# Patient Record
Sex: Male | Born: 1945 | Race: White | Hispanic: No | Marital: Married
Health system: Southern US, Community
[De-identification: ages and names within clinical notes are randomized; demographics above are authoritative.]

---

## 2006-06-12 ENCOUNTER — Ambulatory Visit: Payer: Self-pay | Admitting: Orthopedic Surgery

## 2006-06-12 ENCOUNTER — Other Ambulatory Visit: Payer: Self-pay

## 2006-06-26 ENCOUNTER — Ambulatory Visit: Payer: Self-pay | Admitting: Orthopedic Surgery

## 2006-07-02 ENCOUNTER — Ambulatory Visit: Payer: Self-pay | Admitting: Orthopedic Surgery

## 2008-10-05 IMAGING — US US EXTREM LOW VENOUS*R*
1 series · 17 of 24 positions shown · non-contrast
Comparison: none

REASON FOR EXAM: right calf swelling   CALL report  [id number]
COMMENTS:

[Series 1: us extrem low venous*right* · 29 acquisitions, 17 frames shown]
[im 1/29]
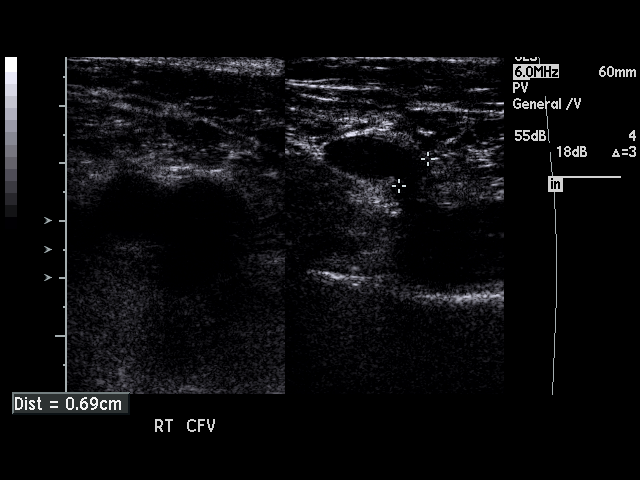
[im 3/29]
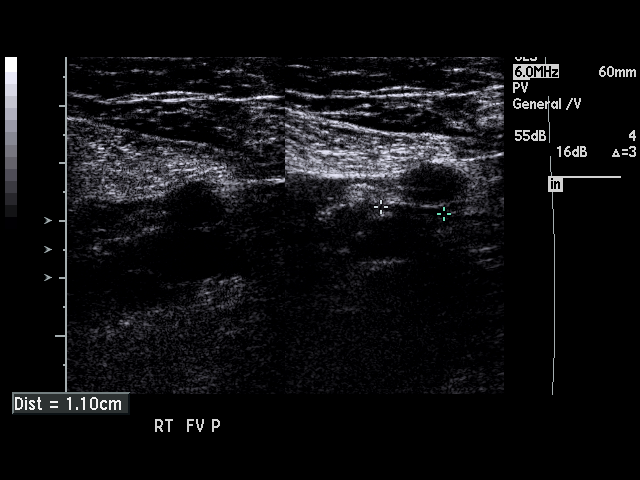
[im 4/29]
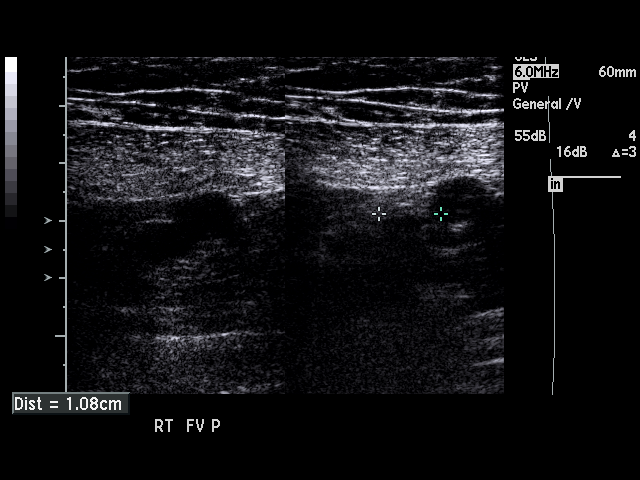
[im 5/29]
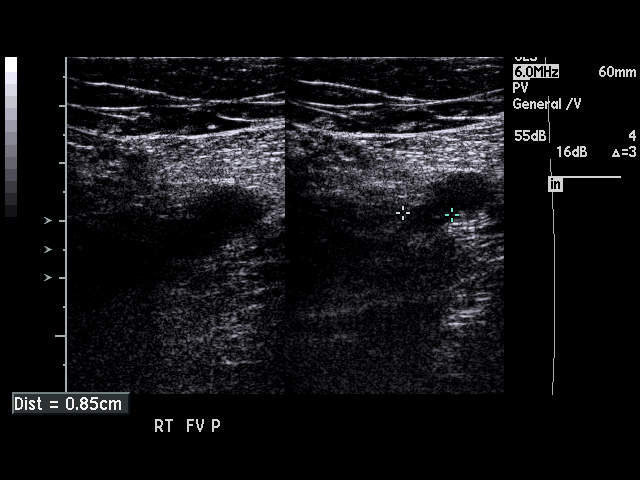
[im 8/29]
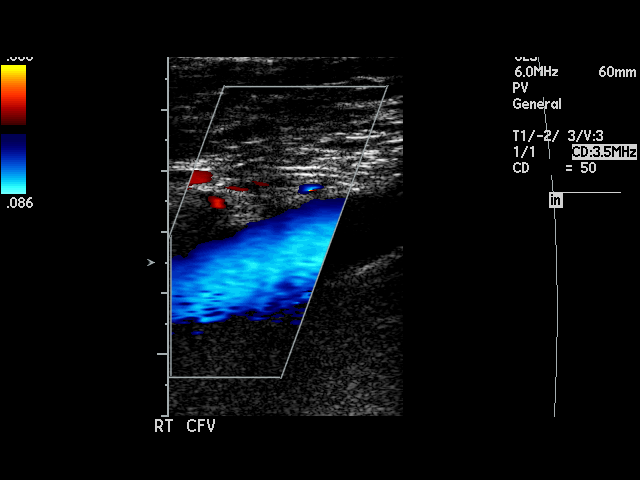
[im 9/29]
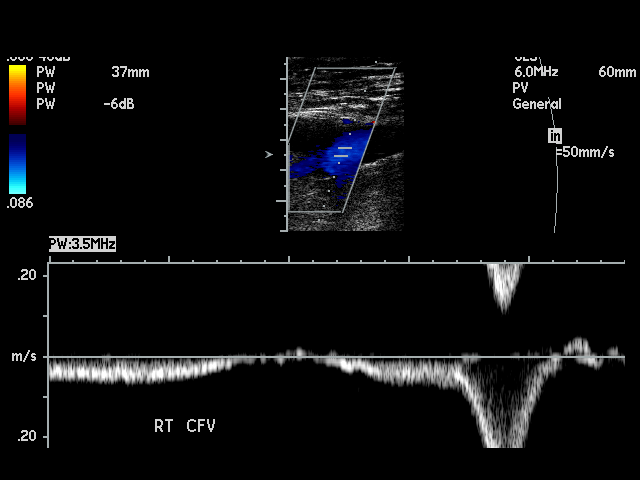
[im 11/29]
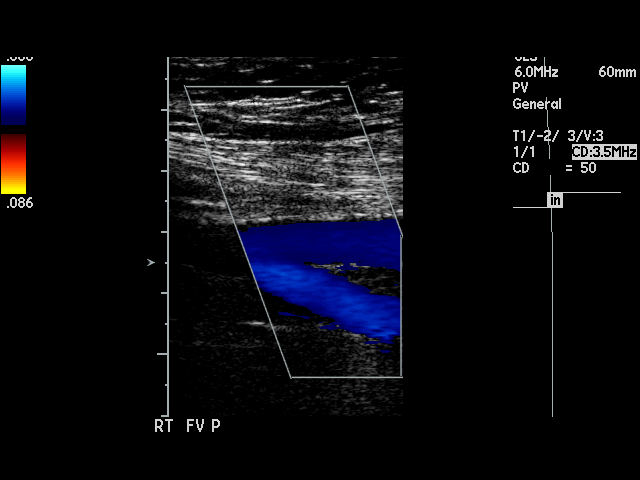
[im 13/29]
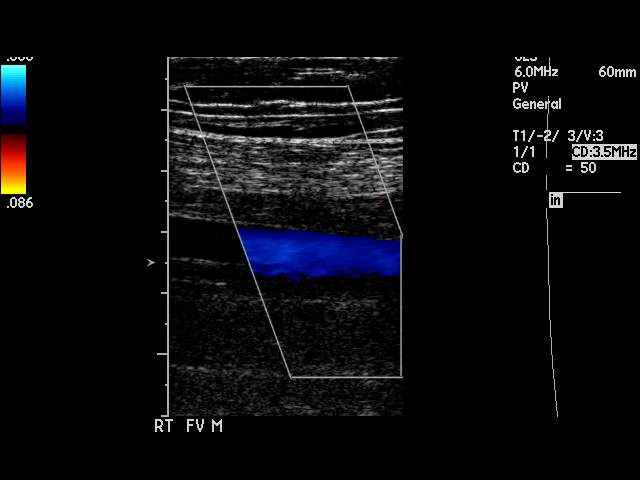
[im 16/29]
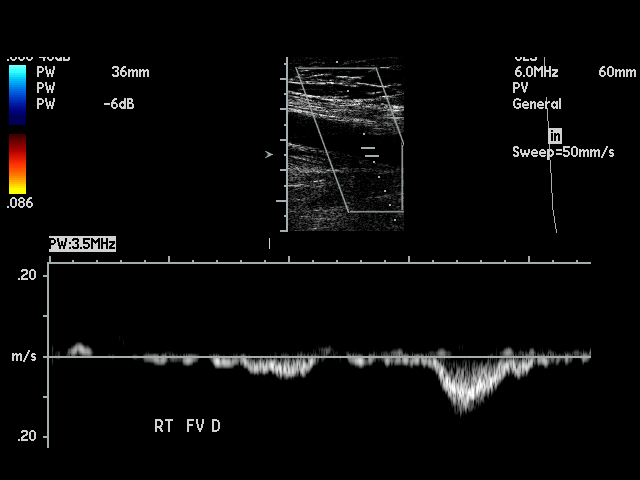
[im 18/29]
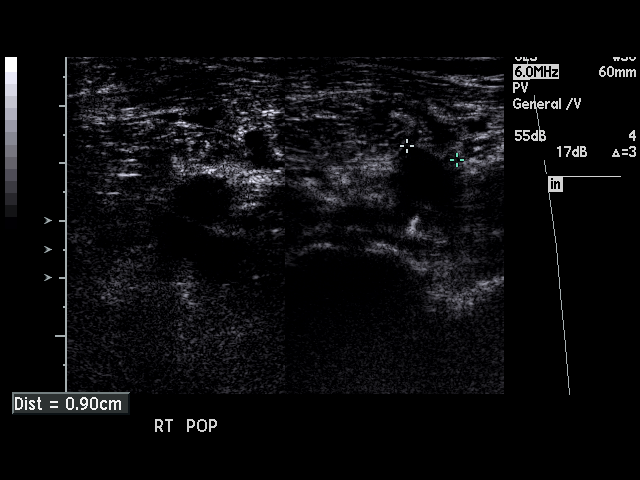
[im 19/29]
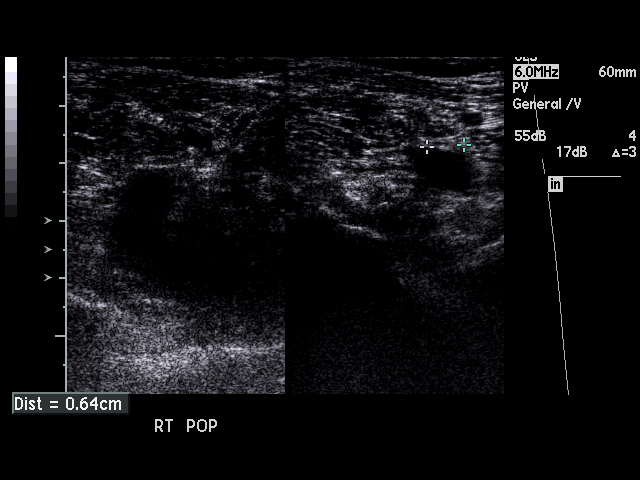
[im 21/29]
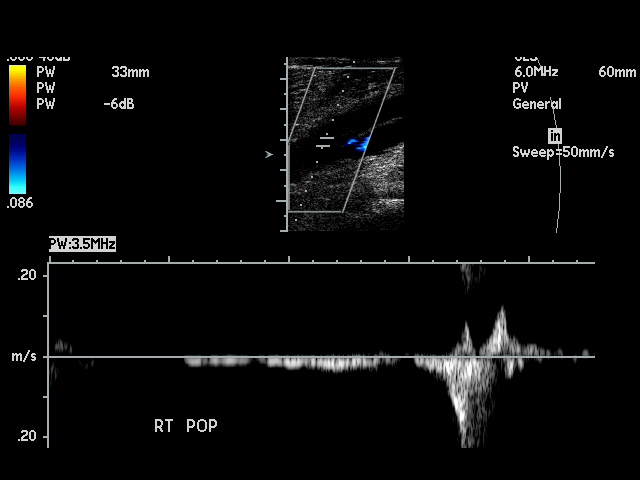
[im 22/29]
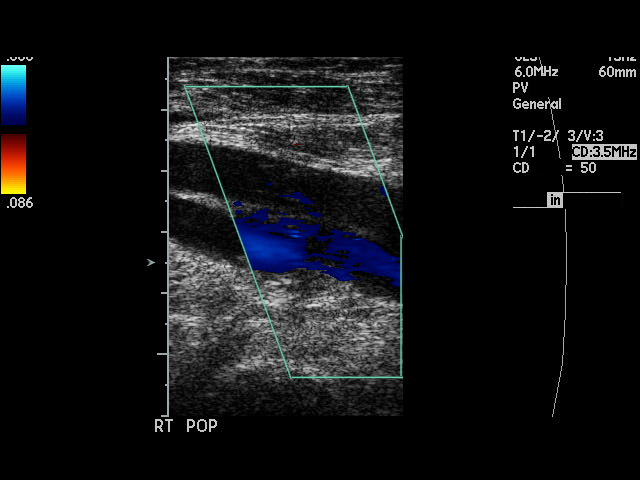
[im 24/29]
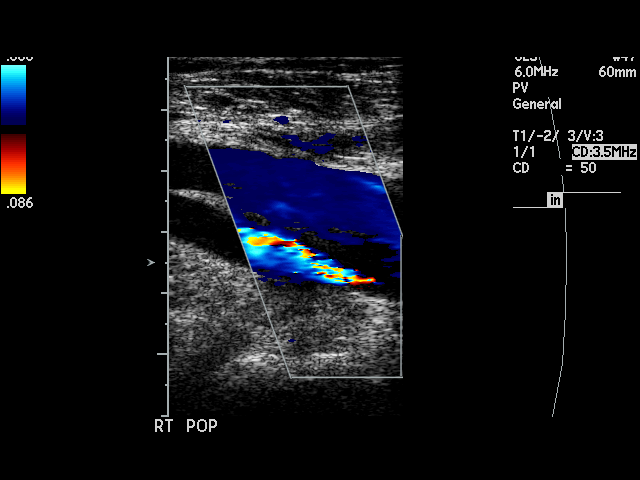
[im 25/29]
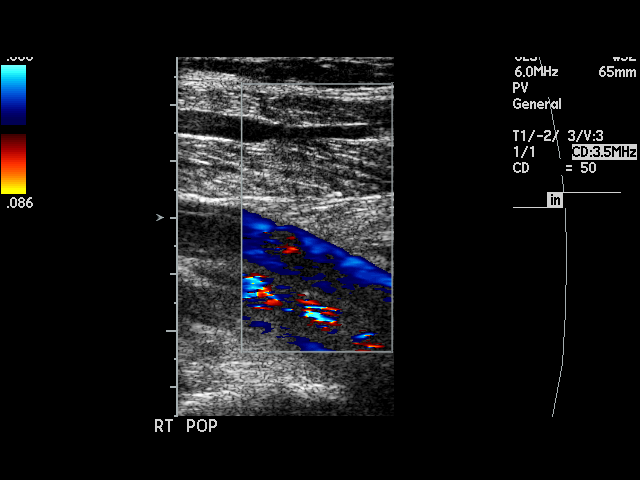
[im 26/29]
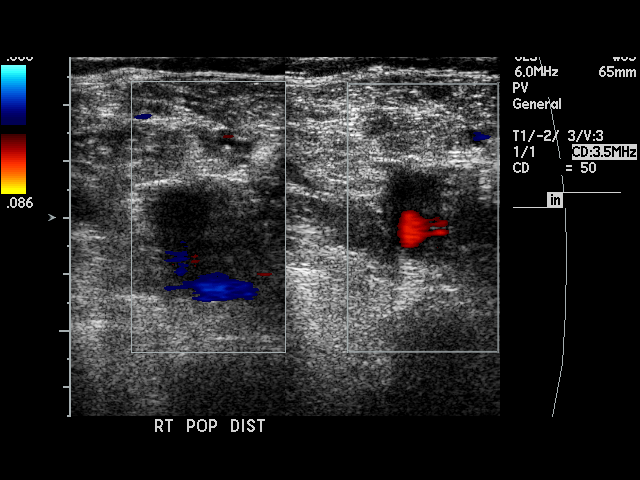
[im 29/29]
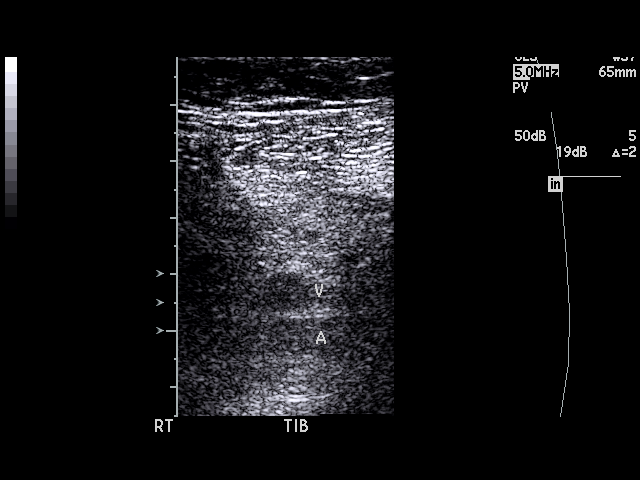

[17 of 24 positions shown; findings below may reference images not displayed]

PROCEDURE:     US  - US DOPPLER LOW EXTR RIGHT  - July 02, 2006  [DATE]

RESULT:     The phasic, augmentation and Valsalva waveforms are normal. The
common femoral and superficial femoral show normal compressibility. The
upper saphenous is patent. In the distal popliteal there is observed loss of
compressibility of the distal popliteal and into the tibial veins. Doppler
examination shows no area of complete occlusion.
IMPRESSION: There is nonocclusive thrombus involving the distal popliteal and extending
to the proximal tibial veins.

## 2019-05-10 ENCOUNTER — Ambulatory Visit: Payer: Self-pay

## 2019-05-21 ENCOUNTER — Ambulatory Visit: Payer: Self-pay

## 2020-01-16 ENCOUNTER — Ambulatory Visit: Payer: Medicare Other | Admitting: Podiatry

## 2020-01-16 ENCOUNTER — Other Ambulatory Visit: Payer: Self-pay

## 2020-01-16 ENCOUNTER — Encounter: Payer: Self-pay | Admitting: Podiatry

## 2020-01-16 ENCOUNTER — Other Ambulatory Visit: Payer: Self-pay | Admitting: Podiatry

## 2020-01-16 ENCOUNTER — Ambulatory Visit (INDEPENDENT_AMBULATORY_CARE_PROVIDER_SITE_OTHER): Payer: Medicare Other

## 2020-01-16 DIAGNOSIS — M79672 Pain in left foot: Secondary | ICD-10-CM

## 2020-01-16 DIAGNOSIS — M722 Plantar fascial fibromatosis: Secondary | ICD-10-CM

## 2020-01-16 DIAGNOSIS — M19071 Primary osteoarthritis, right ankle and foot: Secondary | ICD-10-CM | POA: Diagnosis not present

## 2020-01-16 DIAGNOSIS — M79671 Pain in right foot: Secondary | ICD-10-CM | POA: Diagnosis not present

## 2020-01-16 DIAGNOSIS — Q666 Other congenital valgus deformities of feet: Secondary | ICD-10-CM

## 2020-01-16 DIAGNOSIS — M19072 Primary osteoarthritis, left ankle and foot: Secondary | ICD-10-CM

## 2020-01-16 NOTE — Progress Notes (Signed)
Subjective:  Patient ID: Gordon Brooks, male    DOB: September 24, 1945,  MRN: [id number]  Chief Complaint  Patient presents with  . Foot Pain    Pt stated that he is having a lot of pain in both feet manily in the joint of his big toe     74 y.o. male presents with the above complaint.  Patient presents with complaint of bilateral heel pain that has been going on for quite some time.  He states that he is a big walker enjoys hiking but he started to get a lot of pain in the heel and even more pain in the first metatarsophalangeal joint.  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  Patient states his walking is his main form of exercise.  He denies seeing anyone else prior to seeing me.  He would like to discuss treatment options.  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  His plantar fasciitis pain is very mild in nature however his first metatarsophalangeal joint bilaterally is very painful to touch is 7 out of 10 is dull achy in nature.   Review of Systems: Negative except as noted in the HPI. Denies N/V/F/Ch.  No past medical history on file.  Current Outpatient Medications:  .  lisinopril (ZESTRIL) 40 MG tablet, Take by mouth., Disp: , Rfl:  .  rivaroxaban (XARELTO) 20 MG TABS tablet, Take by mouth., Disp: , Rfl:  .  Cholecalciferol 50 MCG (2000 UT) TABS, Take by mouth., Disp: , Rfl:  .  methylPREDNISolone (MEDROL DOSEPAK) 4 MG TBPK tablet, Take by mouth as directed., Disp: , Rfl:  .  metoprolol succinate (TOPROL-XL) 100 MG 24 hr tablet, Take by mouth., Disp: , Rfl:  .  rosuvastatin (CRESTOR) 20 MG tablet, Take 20 mg by mouth at bedtime., Disp: , Rfl:  .  spironolactone (ALDACTONE) 25 MG tablet, Take 25 mg by mouth daily., Disp: , Rfl:  .  tamsulosin (FLOMAX) 0.4 MG CAPS capsule, Take 0.4 mg by mouth daily., Disp: , Rfl:   Social History   Tobacco Use  Smoking Status Not on file    Not on File Objective:  There  were no vitals filed for this visit. There is no height or weight on file to calculate BMI. Constitutional Well developed. Well nourished.  Vascular Dorsalis pedis pulses palpable bilaterally. Posterior tibial pulses palpable bilaterally. Capillary refill normal to all digits.  No cyanosis or clubbing noted. Pedal hair growth normal.  Neurologic Normal speech. Oriented to person, place, and time. Epicritic sensation to light touch grossly present bilaterally.  Dermatologic Nails well groomed and normal in appearance. No open wounds. No skin lesions.  Orthopedic: Normal joint ROM without pain or crepitus bilaterally. No visible deformities. Tender to palpation at the calcaneal tuber bilaterally. No pain with calcaneal squeeze bilaterally. Ankle ROM diminished range of motion bilaterally. Silfverskiold Test: positive bilaterally.   Radiographs: Taken and reviewed. No acute fractures or dislocations. No evidence of stress fracture.  Plantar heel spur present. Posterior heel spur absent.   Assessment:   1. Foot pain, bilateral   2. Plantar fasciitis of right foot   3. Plantar fasciitis of left foot   4. Arthritis of first metatarsophalangeal (MTP) joint of right foot   5. Arthritis of first metatarsophalangeal (MTP) joint of left foot   6. Pes planovalgus    Plan:  Patient was evaluated and treated and all questions answered.  Plantar  Fasciitis, bilaterally - XR reviewed as above.  - Educated on icing and stretching. Instructions given.  -I will hold off on any injection for now as patient's pain is very mild - DME: Plantar Fascial Brace x2 - Pharmacologic management: None  Bilateral first metatarsophalangeal joint arthritis -I explained to patient the etiology of arthritis and various treatment options were discussed.  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  Patient stated there is pain with it.   He would like to proceed with a steroid injection in both of the joints. -A steroid injection was performed at bilateral first metatarsophalangeal joint using 1% plain Lidocaine and 10 mg of Kenalog. This was well tolerated.  Pes planovalgus -I explained patient the etiology of pes planus deformity and various treatment options were discussed.  Given that patient is having a lot of pain in the first metatarsophalangeal joint as well as the heel I believe will benefit custom-made orthotics to help address the plantar fasciitis as well as Morton's extension of bilateral first metatarsophalangeal joint given the nature of hallux rigidus.  Patient states understanding would like to obtain orthotics -He will be scheduled see rec for custom-made orthotics  No follow-ups on file.

## 2020-02-02 ENCOUNTER — Ambulatory Visit (INDEPENDENT_AMBULATORY_CARE_PROVIDER_SITE_OTHER): Payer: Medicare Other | Admitting: Orthotics

## 2020-02-02 ENCOUNTER — Other Ambulatory Visit: Payer: Self-pay

## 2020-02-02 DIAGNOSIS — M79671 Pain in right foot: Secondary | ICD-10-CM

## 2020-02-02 DIAGNOSIS — M79672 Pain in left foot: Secondary | ICD-10-CM | POA: Diagnosis not present

## 2020-02-02 DIAGNOSIS — M722 Plantar fascial fibromatosis: Secondary | ICD-10-CM

## 2020-02-02 NOTE — Progress Notes (Signed)
Cast today for f/o to address chronic foot pain after hiking 3 or more miles; need deep heel cup, logn support, and forefoot cushioning.

## 2020-02-18 ENCOUNTER — Other Ambulatory Visit: Payer: Self-pay | Admitting: Podiatry

## 2020-02-18 ENCOUNTER — Encounter: Payer: Self-pay | Admitting: Podiatry

## 2020-02-18 ENCOUNTER — Other Ambulatory Visit: Payer: Self-pay

## 2020-02-18 ENCOUNTER — Ambulatory Visit: Payer: Medicare Other | Admitting: Podiatry

## 2020-02-18 ENCOUNTER — Ambulatory Visit (INDEPENDENT_AMBULATORY_CARE_PROVIDER_SITE_OTHER): Payer: Medicare Other

## 2020-02-18 DIAGNOSIS — M722 Plantar fascial fibromatosis: Secondary | ICD-10-CM | POA: Diagnosis not present

## 2020-02-18 DIAGNOSIS — M19071 Primary osteoarthritis, right ankle and foot: Secondary | ICD-10-CM | POA: Diagnosis not present

## 2020-02-18 DIAGNOSIS — M19072 Primary osteoarthritis, left ankle and foot: Secondary | ICD-10-CM | POA: Diagnosis not present

## 2020-02-18 NOTE — Progress Notes (Signed)
Subjective:  Patient ID: Gordon Brooks, male    DOB: Jan 26, 1946,  MRN: [id number]  Chief Complaint  Patient presents with  . Plantar Fasciitis    bilateral plantar fasciitis follow up     74 y.o. male presents with the above complaint.  Patient presents with follow-up of right heel pain that seems to have gotten worse.  Patient states he goes back and forth between left side right side now the right side is hurting a lot.  Patient states that he did not get injection the plantar fashion as it was mild at that time.  Now it seems to be hurting a lot.  Patient also had a secondary follow-up for bilateral first metatarsophalangeal joint arthritis that is severe in nature.  Patient states the injection did not last long.  At this time we did briefly discuss surgical options.  She would like to think about and get back to me in 6 weeks.   Review of Systems: Negative except as noted in the HPI. Denies N/V/F/Ch.  No past medical history on file.  Current Outpatient Medications:  .  Cholecalciferol 50 MCG (2000 UT) TABS, Take by mouth., Disp: , Rfl:  .  lisinopril (ZESTRIL) 40 MG tablet, Take by mouth., Disp: , Rfl:  .  methylPREDNISolone (MEDROL DOSEPAK) 4 MG TBPK tablet, Take by mouth as directed., Disp: , Rfl:  .  metoprolol succinate (TOPROL-XL) 100 MG 24 hr tablet, Take by mouth., Disp: , Rfl:  .  rivaroxaban (XARELTO) 20 MG TABS tablet, Take by mouth., Disp: , Rfl:  .  rosuvastatin (CRESTOR) 20 MG tablet, Take 20 mg by mouth at bedtime., Disp: , Rfl:  .  spironolactone (ALDACTONE) 25 MG tablet, Take 25 mg by mouth daily., Disp: , Rfl:  .  tamsulosin (FLOMAX) 0.4 MG CAPS capsule, Take 0.4 mg by mouth daily., Disp: , Rfl:   Social History   Tobacco Use  Smoking Status Not on file    Not on File Objective:  There were no vitals filed for this visit. There is no height or weight on file to calculate BMI. Constitutional Well developed. Well nourished.  Vascular Dorsalis pedis  pulses palpable bilaterally. Posterior tibial pulses palpable bilaterally. Capillary refill normal to all digits.  No cyanosis or clubbing noted. Pedal hair growth normal.  Neurologic Normal speech. Oriented to person, place, and time. Epicritic sensation to light touch grossly present bilaterally.  Dermatologic Nails well groomed and normal in appearance. No open wounds. No skin lesions.  Orthopedic: Normal joint ROM without pain or crepitus bilaterally. No visible deformities. Tender to palpation at the calcaneal tuber right. No pain with calcaneal squeeze right Ankle ROM diminished range of motion bilaterally Silfverskiold Test: positive bilaterally.   Radiographs: Taken and reviewed. No acute fractures or dislocations. No evidence of stress fracture.  Plantar heel spur present. Posterior heel spur absent.   Assessment:   1. Plantar fasciitis of right foot   2. Plantar fasciitis of left foot    Plan:  Patient was evaluated and treated and all questions answered.  Plantar Fasciitis, right - XR reviewed as above.  - Educated on icing and stretching. Instructions given.  -Injection was delivered to the calcaneal tuber medial side of the right - DME: Continue wearing plantar fascial braces - Pharmacologic management: None  Bilateral first metatarsophalangeal joint arthritis -I explained to patient the etiology of arthritis and various treatment options were discussed.  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   -Clinically patient's pain did not improve from steroid injection and therefore I will hold off on any further injection.  Patient does have severe arthrosis I discussed with him briefly the surgical treatment plans and available to him.  For now we will just work on getting getting orthotics with Morton's extension therefore take the stress off of the joint.  Patient agrees with the plan  Pes  planovalgus -I explained patient the etiology of pes planus deformity and various treatment options were discussed.  Given that patient is having a lot of pain in the first metatarsophalangeal joint as well as the heel I believe will benefit custom-made orthotics to help address the plantar fasciitis as well as Morton's extension of bilateral first metatarsophalangeal joint given the nature of hallux rigidus.  Patient states understanding would like to obtain orthotics -He has been casted for orthotics is awaiting getting orthotics made.  No follow-ups on file.

## 2020-03-02 ENCOUNTER — Ambulatory Visit: Payer: Medicare Other | Admitting: Orthotics

## 2020-03-02 ENCOUNTER — Other Ambulatory Visit: Payer: Self-pay

## 2020-03-02 DIAGNOSIS — M722 Plantar fascial fibromatosis: Secondary | ICD-10-CM

## 2020-03-02 NOTE — Progress Notes (Signed)
Patient picked up f/o and was pleased with fit, comfort, and function.  Worked well with footwear.  Told of rbeak in period and how to report any issues.  

## 2020-04-21 ENCOUNTER — Encounter: Payer: Self-pay | Admitting: Podiatry

## 2020-04-21 ENCOUNTER — Ambulatory Visit: Payer: Medicare Other | Admitting: Podiatry

## 2020-04-21 ENCOUNTER — Other Ambulatory Visit: Payer: Self-pay

## 2020-04-21 DIAGNOSIS — M722 Plantar fascial fibromatosis: Secondary | ICD-10-CM | POA: Diagnosis not present

## 2020-04-21 NOTE — Progress Notes (Signed)
Subjective:  Patient ID: Gordon Brooks, male    DOB: [birth year],  MRN: [id number]  Chief Complaint  Patient presents with  . Plantar Fasciitis    PT stated that he is doing okay he still has some pain.    75 y.o. male presents with the above complaint.  Patient presents with a follow-up of right plantar fasciitis.  Patient states that he is doing a lot better.  He still has occasional on and off pain but today he does not have any pain.  He has been wearing his brace as well as orthotics which has been helping.  I encouraged him to utilize orthotics more and can transition out of the brace.  He states understanding.  He denies any other acute complaints.  Overall he is about 90 to 95% improved   Review of Systems: Negative except as noted in the HPI. Denies N/V/F/Ch.  No past medical history on file.  Current Outpatient Medications:  .  Cholecalciferol 50 MCG (2000 UT) TABS, Take by mouth., Disp: , Rfl:  .  lisinopril (ZESTRIL) 40 MG tablet, Take by mouth., Disp: , Rfl:  .  methylPREDNISolone (MEDROL DOSEPAK) 4 MG TBPK tablet, Take by mouth as directed., Disp: , Rfl:  .  metoprolol succinate (TOPROL-XL) 100 MG 24 hr tablet, Take by mouth., Disp: , Rfl:  .  rivaroxaban (XARELTO) 20 MG TABS tablet, Take by mouth., Disp: , Rfl:  .  rosuvastatin (CRESTOR) 20 MG tablet, Take 20 mg by mouth at bedtime., Disp: , Rfl:  .  spironolactone (ALDACTONE) 25 MG tablet, Take 25 mg by mouth daily., Disp: , Rfl:  .  tamsulosin (FLOMAX) 0.4 MG CAPS capsule, Take 0.4 mg by mouth daily., Disp: , Rfl:   Social History   Tobacco Use  Smoking Status Not on file  Smokeless Tobacco Not on file    Not on File Objective:  There were no vitals filed for this visit. There is no height or weight on file to calculate BMI. Constitutional Well developed. Well nourished.  Vascular Dorsalis pedis pulses palpable bilaterally. Posterior tibial pulses palpable bilaterally. Capillary refill normal to all  digits.  No cyanosis or clubbing noted. Pedal hair growth normal.  Neurologic Normal speech. Oriented to person, place, and time. Epicritic sensation to light touch grossly present bilaterally.  Dermatologic Nails well groomed and normal in appearance. No open wounds. No skin lesions.  Orthopedic: Normal joint ROM without pain or crepitus bilaterally. No visible deformities. Now tender to palpation at the calcaneal tuber right. No pain with calcaneal squeeze right Ankle ROM diminished range of motion bilaterally Silfverskiold Test: positive bilaterally.   Radiographs: Taken and reviewed. No acute fractures or dislocations. No evidence of stress fracture.  Plantar heel spur present. Posterior heel spur absent.   Assessment:   1. Plantar fasciitis of right foot    Plan:  Patient was evaluated and treated and all questions answered.  Plantar Fasciitis, right -Clinically healed.  We will continue to monitor with orthotics.  I encouraged utilizing orthotics with good shoes which she states he is already doing so.  If any foot and ankle issues arise in the future come back and see me.  Patient states understanding  Bilateral first metatarsophalangeal joint arthritis -I explained to patient the etiology of arthritis and various treatment options were discussed.  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   -Clinically patient's pain did not improve from steroid injection  and therefore I will hold off on any further injection.  Patient does have severe arthrosis I discussed with him briefly the surgical treatment plans and available to him.  For now we will just work on getting getting orthotics with Morton's extension therefore take the stress off of the joint.  Patient agrees with the plan  Pes planovalgus -I explained patient the etiology of pes planus deformity and various treatment options were discussed.  Given  that patient is having a lot of pain in the first metatarsophalangeal joint as well as the heel I believe will benefit custom-made orthotics to help address the plantar fasciitis as well as Morton's extension of bilateral first metatarsophalangeal joint given the nature of hallux rigidus.  Patient states understanding would like to obtain orthotics -Patient has obtained orthotics and functioning well in them.  No follow-ups on file.

## 2020-07-14 ENCOUNTER — Ambulatory Visit (INDEPENDENT_AMBULATORY_CARE_PROVIDER_SITE_OTHER): Payer: Medicare Other | Admitting: Podiatry

## 2020-07-14 ENCOUNTER — Other Ambulatory Visit: Payer: Self-pay

## 2020-07-14 DIAGNOSIS — Z01818 Encounter for other preprocedural examination: Secondary | ICD-10-CM | POA: Diagnosis not present

## 2020-07-14 DIAGNOSIS — M2021 Hallux rigidus, right foot: Secondary | ICD-10-CM

## 2020-07-14 DIAGNOSIS — M19071 Primary osteoarthritis, right ankle and foot: Secondary | ICD-10-CM

## 2020-07-15 ENCOUNTER — Encounter: Payer: Self-pay | Admitting: Podiatry

## 2020-07-15 NOTE — Progress Notes (Signed)
Subjective:  Patient ID: Gordon Brooks, male    DOB: Apr 02, 1946,  MRN: [id number]  Chief Complaint  Patient presents with  . Plantar Fasciitis    Surgery consult     75 y.o. male presents with the above complaint.  Patient presents with a follow-up of right plantar fasciitis.  Patient states that he is doing a lot better.  He still has occasional on and off pain but today he does not have any pain.  He has been wearing his brace as well as orthotics which has been helping.  I encouraged him to utilize orthotics more and can transition out of the brace.  He states understanding.  He denies any other acute complaints.  Overall he is about 90 to 95% improved   Review of Systems: Negative except as noted in the HPI. Denies N/V/F/Ch.  No past medical history on file.  Current Outpatient Medications:  .  Cholecalciferol 50 MCG (2000 UT) TABS, Take by mouth., Disp: , Rfl:  .  lisinopril (ZESTRIL) 40 MG tablet, Take by mouth., Disp: , Rfl:  .  methylPREDNISolone (MEDROL DOSEPAK) 4 MG TBPK tablet, Take by mouth as directed., Disp: , Rfl:  .  metoprolol succinate (TOPROL-XL) 100 MG 24 hr tablet, Take by mouth., Disp: , Rfl:  .  rivaroxaban (XARELTO) 20 MG TABS tablet, Take by mouth., Disp: , Rfl:  .  rosuvastatin (CRESTOR) 20 MG tablet, Take 20 mg by mouth at bedtime., Disp: , Rfl:  .  spironolactone (ALDACTONE) 25 MG tablet, Take 25 mg by mouth daily., Disp: , Rfl:  .  tamsulosin (FLOMAX) 0.4 MG CAPS capsule, Take 0.4 mg by mouth daily., Disp: , Rfl:   Social History   Tobacco Use  Smoking Status Not on file  Smokeless Tobacco Not on file    Not on File Objective:  There were no vitals filed for this visit. There is no height or weight on file to calculate BMI. Constitutional Well developed. Well nourished.  Vascular Dorsalis pedis pulses palpable bilaterally. Posterior tibial pulses palpable bilaterally. Capillary refill normal to all digits.  No cyanosis or clubbing  noted. Pedal hair growth normal.  Neurologic Normal speech. Oriented to person, place, and time. Epicritic sensation to light touch grossly present bilaterally.  Dermatologic Nails well groomed and normal in appearance. No open wounds. No skin lesions.  Orthopedic: Normal joint ROM without pain or crepitus bilaterally. No visible deformities. Now tender to palpation at the calcaneal tuber right. No pain with calcaneal squeeze right Ankle ROM diminished range of motion bilaterally Silfverskiold Test: positive bilaterally.   Radiographs: Taken and reviewed. No acute fractures or dislocations. No evidence of stress fracture.  Plantar heel spur present. Posterior heel spur absent.   Assessment:   1. Arthritis of first metatarsophalangeal (MTP) joint of right foot   2. Preoperative examination   3. Hallux rigidus of right foot    Plan:  Patient was evaluated and treated and all questions answered.  Plantar Fasciitis, right -Clinically healed.  We will continue to monitor with orthotics.  I encouraged utilizing orthotics with good shoes which she states he is already doing so.  If any foot and ankle issues arise in the future come back and see me.  Patient states understanding  Bilateral first metatarsophalangeal joint arthritis joint right greater than left -I discussed with the patient that given that patient is having continuous pain to the right first metatarsophalangeal that he would benefit from surgical fusion of the joint.  Patient agrees  with the plan would like to proceed with the fusion of the first metatarsophalangeal joint with fixation.  I discussed my radiographical findings with the patient in extensive detail given that there is severe arthrosis with hallux rigidus present I believe he will benefit from surgical fusion.  He has failed multiple conservative treatment options including orthotics as well as multiple injections.  He now wishes to undergo surgical fusion. -I  discussed my postop protocol as well as my skin incision placement.  He will be nonweightbearing for 3 to 4 weeks followed by weightbearing as tolerated.  Patient states understanding would like to proceed with the surgery. -Informed surgical risk consent was reviewed and read aloud to the patient.  I reviewed the films.  I have discussed my findings with the patient in great detail.  I have discussed all risks including but not limited to infection, stiffness, scarring, limp, disability, deformity, damage to blood vessels and nerves, numbness, poor healing, need for braces, arthritis, chronic pain, amputation, death.  All benefits and realistic expectations discussed in great detail.  I have made no promises as to the outcome.  I have provided realistic expectations.  I have offered the patient a 2nd opinion, which they have declined and assured me they preferred to proceed despite the risks -A total of 33 minutes was spent in direct patient care as well as pre and post patient encounter activities.  This includes documentation as well as reviewing patient chart for labs, imaging, past medical, surgical, social, and family history as documented in the EMR.  I have reviewed medication allergies as documented in EMR.  I discussed the etiology of condition and treatment options from conservative to surgical care.  All risks and benefit of the treatment course was discussed in detail.  All questions were answered and return appointment was discussed.  Since the visit completed in an ambulatory/outpatient setting, the patient and/or parent/guardian has been advised to contact the providers office for worsening condition and seek medical treatment and/or call 911 if the patient deems either is necessary.   Pes planovalgus -I explained patient the etiology of pes planus deformity and various treatment options were discussed.  Given that patient is having a lot of pain in the first metatarsophalangeal joint as well  as the heel I believe will benefit custom-made orthotics to help address the plantar fasciitis as well as Morton's extension of bilateral first metatarsophalangeal joint given the nature of hallux rigidus.  Patient states understanding would like to obtain orthotics -Patient has obtained orthotics and functioning well in them.  No follow-ups on file.

## 2020-08-20 ENCOUNTER — Telehealth: Payer: Self-pay | Admitting: Urology

## 2020-08-20 NOTE — Telephone Encounter (Signed)
DOS- 09/13/20  HALLUX MPJ FUSION RIGHT --- [id number]   [employer] EFFECTIVE DATE - 05/11/20   PLAN DEDUCTIBLE - $100.00 W/ $0.00 REMAINING OUT OF POCKET - $1,000.00 W/ $716.30 REAMINING COINSURANCE - 0% COPAY - $0.00   PER [employer] WEB SITE FOR CPT CODE 55732 Notification or Prior Authorization is not required for the requested services   Decision ID #:[id number]

## 2020-09-13 ENCOUNTER — Other Ambulatory Visit: Payer: Self-pay | Admitting: Podiatry

## 2020-09-13 ENCOUNTER — Encounter: Payer: Self-pay | Admitting: Podiatry

## 2020-09-13 DIAGNOSIS — M13871 Other specified arthritis, right ankle and foot: Secondary | ICD-10-CM

## 2020-09-13 MED ORDER — OXYCODONE-ACETAMINOPHEN 5-325 MG PO TABS: 1.0000 | ORAL_TABLET | ORAL | 0 refills | Status: DC | PRN

## 2020-09-13 MED ORDER — IBUPROFEN 800 MG PO TABS: 800.0000 mg | ORAL_TABLET | Freq: Four times a day (QID) | ORAL | 1 refills | Status: AC | PRN

## 2020-09-22 ENCOUNTER — Ambulatory Visit (INDEPENDENT_AMBULATORY_CARE_PROVIDER_SITE_OTHER): Payer: Medicare Other | Admitting: Podiatry

## 2020-09-22 ENCOUNTER — Ambulatory Visit (INDEPENDENT_AMBULATORY_CARE_PROVIDER_SITE_OTHER): Payer: Medicare Other

## 2020-09-22 ENCOUNTER — Other Ambulatory Visit: Payer: Self-pay

## 2020-09-22 DIAGNOSIS — M19071 Primary osteoarthritis, right ankle and foot: Secondary | ICD-10-CM | POA: Diagnosis not present

## 2020-09-22 DIAGNOSIS — Z9889 Other specified postprocedural states: Secondary | ICD-10-CM

## 2020-09-24 ENCOUNTER — Encounter: Payer: Self-pay | Admitting: Podiatry

## 2020-09-24 NOTE — Progress Notes (Signed)
  Subjective:  Patient ID: Gordon Brooks, male    DOB: November 08, 1945,  MRN: [id number]  Chief Complaint  Patient presents with   Routine Post Op    POST OP DOS 6.6.22    DOS: 09/13/2020 Procedure: Right first MPJ fusion  75 y.o. male returns for post-op check.  Patient states that he is doing well.  Mild pain.  He is mostly taking ibuprofen if needed.  He has not been nonweightbearing to the right lower extremity denies any other acute complaints.  Review of Systems: Negative except as noted in the HPI. Denies N/V/F/Ch.  No past medical history on file.  Current Outpatient Medications:    Cholecalciferol 50 MCG (2000 UT) TABS, Take by mouth., Disp: , Rfl:    ibuprofen (ADVIL) 800 MG tablet, Take 1 tablet (800 mg total) by mouth every 6 (six) hours as needed., Disp: 60 tablet, Rfl: 1   lisinopril (ZESTRIL) 40 MG tablet, Take by mouth., Disp: , Rfl:    methylPREDNISolone (MEDROL DOSEPAK) 4 MG TBPK tablet, Take by mouth as directed., Disp: , Rfl:    metoprolol succinate (TOPROL-XL) 100 MG 24 hr tablet, Take by mouth., Disp: , Rfl:    oxyCODONE-acetaminophen (PERCOCET) 5-325 MG tablet, Take 1-2 tablets by mouth every 4 (four) hours as needed for severe pain., Disp: 30 tablet, Rfl: 0   rivaroxaban (XARELTO) 20 MG TABS tablet, Take by mouth., Disp: , Rfl:    rosuvastatin (CRESTOR) 20 MG tablet, Take 20 mg by mouth at bedtime., Disp: , Rfl:    spironolactone (ALDACTONE) 25 MG tablet, Take 25 mg by mouth daily., Disp: , Rfl:    tamsulosin (FLOMAX) 0.4 MG CAPS capsule, Take 0.4 mg by mouth daily., Disp: , Rfl:   Social History   Tobacco Use  Smoking Status Not on file  Smokeless Tobacco Not on file    Not on File Objective:  There were no vitals filed for this visit. There is no height or weight on file to calculate BMI. Constitutional Well developed. Well nourished.  Vascular Foot warm and well perfused. Capillary refill normal to all digits.   Neurologic Normal speech. Oriented to  person, place, and time. Epicritic sensation to light touch grossly present bilaterally.  Dermatologic Skin healing well without signs of infection. Skin edges well coapted without signs of infection.  Orthopedic: Tenderness to palpation noted about the surgical site.   Radiographs: 3 views of skeletally mature the right foot: Hardware is intact.  No loosening or backing out noted.  Consolidation is starting to be seen.  Good alignment of correction noted Assessment:   1. Arthritis of first metatarsophalangeal (MTP) joint of right foot   2. Status post foot surgery    Plan:  Patient was evaluated and treated and all questions answered.  S/p foot surgery right -Progressing as expected post-operatively. -XR: See above -WB Status: Nonweightbearing to the right lower extremity with a knee scooter/crutches -Sutures: Intact.  No clinical signs of dehiscence noted.  No complication noted. -Medications:  -Foot redressed.  No follow-ups on file.

## 2020-10-06 ENCOUNTER — Ambulatory Visit (INDEPENDENT_AMBULATORY_CARE_PROVIDER_SITE_OTHER): Payer: Medicare Other | Admitting: Podiatry

## 2020-10-06 ENCOUNTER — Other Ambulatory Visit: Payer: Self-pay

## 2020-10-06 ENCOUNTER — Encounter: Payer: Self-pay | Admitting: Podiatry

## 2020-10-06 DIAGNOSIS — M19071 Primary osteoarthritis, right ankle and foot: Secondary | ICD-10-CM

## 2020-10-06 DIAGNOSIS — Z9889 Other specified postprocedural states: Secondary | ICD-10-CM

## 2020-10-06 NOTE — Progress Notes (Signed)
  Subjective:  Patient ID: Gordon Brooks, male    DOB: 05-23-1945,  MRN: [id number]  Chief Complaint  Patient presents with   Routine Post Op    POST OP DOS 6.6.22    DOS: 09/13/2020 Procedure: Right first MPJ fusion  75 y.o. male returns for post-op check.  Patient states that he is doing well.  Mild pain.  He is mostly taking ibuprofen if needed.  He has not been nonweightbearing to the right lower extremity denies any other acute complaints.  Review of Systems: Negative except as noted in the HPI. Denies N/V/F/Ch.  No past medical history on file.  Current Outpatient Medications:    Cholecalciferol 50 MCG (2000 UT) TABS, Take by mouth., Disp: , Rfl:    ibuprofen (ADVIL) 800 MG tablet, Take 1 tablet (800 mg total) by mouth every 6 (six) hours as needed., Disp: 60 tablet, Rfl: 1   lisinopril (ZESTRIL) 40 MG tablet, Take by mouth., Disp: , Rfl:    methylPREDNISolone (MEDROL DOSEPAK) 4 MG TBPK tablet, Take by mouth as directed., Disp: , Rfl:    metoprolol succinate (TOPROL-XL) 100 MG 24 hr tablet, Take by mouth., Disp: , Rfl:    oxyCODONE-acetaminophen (PERCOCET) 5-325 MG tablet, Take 1-2 tablets by mouth every 4 (four) hours as needed for severe pain., Disp: 30 tablet, Rfl: 0   rivaroxaban (XARELTO) 20 MG TABS tablet, Take by mouth., Disp: , Rfl:    rosuvastatin (CRESTOR) 20 MG tablet, Take 20 mg by mouth at bedtime., Disp: , Rfl:    spironolactone (ALDACTONE) 25 MG tablet, Take 25 mg by mouth daily., Disp: , Rfl:    tamsulosin (FLOMAX) 0.4 MG CAPS capsule, Take 0.4 mg by mouth daily., Disp: , Rfl:   Social History   Tobacco Use  Smoking Status Not on file  Smokeless Tobacco Not on file    Not on File Objective:  There were no vitals filed for this visit. There is no height or weight on file to calculate BMI. Constitutional Well developed. Well nourished.  Vascular Foot warm and well perfused. Capillary refill normal to all digits.   Neurologic Normal speech. Oriented to  person, place, and time. Epicritic sensation to light touch grossly present bilaterally.  Dermatologic Skin completely epithelialized.  No clinical signs of infection noted.  No dehiscence noted.  Stiff first MPJ joint noted.  Good range of motion noted at the IPJ joint  Orthopedic: No tenderness to palpation noted about the surgical site.   Radiographs: 3 views of skeletally mature the right foot: Hardware is intact.  No loosening or backing out noted.  Consolidation is starting to be seen.  Good alignment of correction noted Assessment:   1. Arthritis of first metatarsophalangeal (MTP) joint of right foot   2. Status post foot surgery     Plan:  Patient was evaluated and treated and all questions answered.  S/p foot surgery right -Progressing as expected post-operatively. -XR: See above -WB Status: Weightbearing as tolerated with a cam boot -Sutures: Removed no clinical signs of dehiscence noted.  No complication noted. -Medications:  -Foot redressed.  No follow-ups on file.

## 2020-11-03 ENCOUNTER — Encounter: Payer: Self-pay | Admitting: Podiatry

## 2020-11-03 ENCOUNTER — Other Ambulatory Visit: Payer: Self-pay

## 2020-11-03 ENCOUNTER — Ambulatory Visit (INDEPENDENT_AMBULATORY_CARE_PROVIDER_SITE_OTHER): Payer: Medicare Other | Admitting: Podiatry

## 2020-11-03 DIAGNOSIS — Z9889 Other specified postprocedural states: Secondary | ICD-10-CM

## 2020-11-03 DIAGNOSIS — M19071 Primary osteoarthritis, right ankle and foot: Secondary | ICD-10-CM

## 2020-11-03 NOTE — Progress Notes (Signed)
  Subjective:  Patient ID: Gordon Brooks, male    DOB: 31-Oct-1945,  MRN: [id number]  Chief Complaint  Patient presents with   Routine Post Op    POST OP DOS 6.6.22    DOS: 09/13/2020 Procedure: Right first MPJ fusion  75 y.o. male returns for post-op check.  Patient states that he is doing well.  Mild pain.  He is mostly taking ibuprofen if needed.  He has been weightbearing as tolerated with a cam boot..  Review of Systems: Negative except as noted in the HPI. Denies N/V/F/Ch.  No past medical history on file.  Current Outpatient Medications:    Cholecalciferol 50 MCG (2000 UT) TABS, Take by mouth., Disp: , Rfl:    ibuprofen (ADVIL) 800 MG tablet, Take 1 tablet (800 mg total) by mouth every 6 (six) hours as needed., Disp: 60 tablet, Rfl: 1   lisinopril (ZESTRIL) 40 MG tablet, Take by mouth., Disp: , Rfl:    methylPREDNISolone (MEDROL DOSEPAK) 4 MG TBPK tablet, Take by mouth as directed., Disp: , Rfl:    metoprolol succinate (TOPROL-XL) 100 MG 24 hr tablet, Take by mouth., Disp: , Rfl:    oxyCODONE-acetaminophen (PERCOCET) 5-325 MG tablet, Take 1-2 tablets by mouth every 4 (four) hours as needed for severe pain., Disp: 30 tablet, Rfl: 0   rivaroxaban (XARELTO) 20 MG TABS tablet, Take by mouth., Disp: , Rfl:    rosuvastatin (CRESTOR) 20 MG tablet, Take 20 mg by mouth at bedtime., Disp: , Rfl:    spironolactone (ALDACTONE) 25 MG tablet, Take 25 mg by mouth daily., Disp: , Rfl:    tamsulosin (FLOMAX) 0.4 MG CAPS capsule, Take 0.4 mg by mouth daily., Disp: , Rfl:   Social History   Tobacco Use  Smoking Status Not on file  Smokeless Tobacco Not on file    Not on File Objective:  There were no vitals filed for this visit. There is no height or weight on file to calculate BMI. Constitutional Well developed. Well nourished.  Vascular Foot warm and well perfused. Capillary refill normal to all digits.   Neurologic Normal speech. Oriented to person, place, and time. Epicritic  sensation to light touch grossly present bilaterally.  Dermatologic Skin completely epithelialized.  No clinical signs of infection noted.  No dehiscence noted.  Stiff first MPJ joint noted.  Good range of motion noted at the IPJ joint  Orthopedic: No tenderness to palpation noted about the surgical site.   Radiographs: 3 views of skeletally mature the right foot: Hardware is intact.  No loosening or backing out noted.  Consolidation is starting to be seen.  Good alignment of correction noted Assessment:   1. Arthritis of first metatarsophalangeal (MTP) joint of right foot   2. Status post foot surgery      Plan:  Patient was evaluated and treated and all questions answered.  S/p foot surgery right -Progressing as expected post-operatively. -XR: See above -WB Status: Weightbearing as tolerated with a cam boot -Sutures: Removed no clinical signs of dehiscence noted.  No complication noted. -Medications:  -Clinically #patient no longer has any pain.  He has been ambulating in boot without any restriction.  At this time I asked him to transition to regular shoes and if any foot and ankle issues arise in future I will asked him to come see me.  Patient states understanding.  No follow-ups on file.

## 2021-05-20 ENCOUNTER — Other Ambulatory Visit: Payer: Self-pay

## 2021-05-20 ENCOUNTER — Other Ambulatory Visit: Payer: Self-pay | Admitting: Family Medicine

## 2021-05-20 ENCOUNTER — Ambulatory Visit
Admission: RE | Admit: 2021-05-20 | Discharge: 2021-05-20 | Disposition: A | Discharge status: Home / Self Care | Payer: Self-pay | Source: Ambulatory Visit | Attending: Family Medicine | Admitting: Family Medicine

## 2021-05-20 DIAGNOSIS — R058 Other specified cough: Secondary | ICD-10-CM

## 2021-09-22 ENCOUNTER — Ambulatory Visit: Payer: Medicare Other | Admitting: Podiatry

## 2021-09-22 ENCOUNTER — Ambulatory Visit (INDEPENDENT_AMBULATORY_CARE_PROVIDER_SITE_OTHER): Payer: Medicare Other

## 2021-09-22 DIAGNOSIS — M19071 Primary osteoarthritis, right ankle and foot: Secondary | ICD-10-CM

## 2021-09-22 DIAGNOSIS — L819 Disorder of pigmentation, unspecified: Secondary | ICD-10-CM

## 2021-09-22 DIAGNOSIS — Z9889 Other specified postprocedural states: Secondary | ICD-10-CM

## 2021-09-23 NOTE — Progress Notes (Signed)
Subjective:  Patient ID: Gordon Brooks, male    DOB: 12-28-1945,  MRN: [id number]  Chief Complaint  Patient presents with   Toe Pain    Right hallux toe discoloration     76 y.o. male presents with the above complaint.  Patient presents with concern of discoloration/darkening of the skin around the first MPJ site.  He wanted to make sure things healing well.  He had a surgery done for MPJ fusion on 09/13/2020.  He states been a year is doing well some parts of.  He wanted evaluated make sure that there is nothing going on.  It does not hurt him too much she is able to ambulate without any acute issues.   Review of Systems: Negative except as noted in the HPI. Denies N/V/F/Ch.  No past medical history on file.  Current Outpatient Medications:    Cholecalciferol 50 MCG (2000 UT) TABS, Take by mouth., Disp: , Rfl:    ibuprofen (ADVIL) 800 MG tablet, Take 1 tablet (800 mg total) by mouth every 6 (six) hours as needed., Disp: 60 tablet, Rfl: 1   lisinopril (ZESTRIL) 40 MG tablet, Take by mouth., Disp: , Rfl:    methylPREDNISolone (MEDROL DOSEPAK) 4 MG TBPK tablet, Take by mouth as directed., Disp: , Rfl:    metoprolol succinate (TOPROL-XL) 100 MG 24 hr tablet, Take by mouth., Disp: , Rfl:    oxyCODONE-acetaminophen (PERCOCET) 5-325 MG tablet, Take 1-2 tablets by mouth every 4 (four) hours as needed for severe pain., Disp: 30 tablet, Rfl: 0   rivaroxaban (XARELTO) 20 MG TABS tablet, Take by mouth., Disp: , Rfl:    rosuvastatin (CRESTOR) 20 MG tablet, Take 20 mg by mouth at bedtime., Disp: , Rfl:    spironolactone (ALDACTONE) 25 MG tablet, Take 25 mg by mouth daily., Disp: , Rfl:    tamsulosin (FLOMAX) 0.4 MG CAPS capsule, Take 0.4 mg by mouth daily., Disp: , Rfl:   Social History   Tobacco Use  Smoking Status Not on file  Smokeless Tobacco Not on file    Not on File Objective:  There were no vitals filed for this visit. There is no height or weight on file to calculate  BMI. Constitutional Well developed. Well nourished.  Vascular Dorsalis pedis pulses palpable bilaterally. Posterior tibial pulses palpable bilaterally. Capillary refill normal to all digits.  No cyanosis or clubbing noted. Pedal hair growth normal.  Neurologic Normal speech. Oriented to person, place, and time. Epicritic sensation to light touch grossly present bilaterally.  Dermatologic Nails well groomed and normal in appearance. No open wounds. No skin lesions.  Orthopedic: Pain on palpation right first metatarsophalangeal joint.  No range of motion noted to the MPJ joint.  Stiff rigid MPJ joint noted.  No pain with range of motion of the interphalangeal joint.  No sesamoid-itis pain noted.  Very mild pain on palpation to the hardware.   Radiographs: 3 views of skeletally mature the right foot: Hardware is intact no signs of loosening or backing out noted.  Per x-rays solid fusion noted across the first MPJ site.  There may be small area of pseudoarthrosis present. Assessment:   1. Discolored skin   2. Arthritis of first metatarsophalangeal (MTP) joint of right foot    Plan:  Patient was evaluated and treated and all questions answered.  Right first metatarsophalangeal joint discoloration with underlying history of fusion/arthrodesis -All questions and concerns were discussed with the patient in extensive detail -I discussed with him that sometimes longstanding edema  around the joint can lead to discoloration of the toe.  There is no concern for gangrene or infection.  Patient states understanding -At this time clinically does not bother him too much.  If his pain continues to get worse or bothers him a lot we will plan on getting a CT scan to assess the fusion.  Prior x-ray it looks like there is a lot of consolidation across the first MPJ site. -I discussed shoe gear modification as well  No follow-ups on file.

## 2022-07-03 ENCOUNTER — Other Ambulatory Visit: Payer: Self-pay | Admitting: Physician Assistant

## 2022-07-03 ENCOUNTER — Ambulatory Visit
Admission: RE | Admit: 2022-07-03 | Discharge: 2022-07-03 | Disposition: A | Discharge status: Home / Self Care | Payer: Medicare Other | Source: Ambulatory Visit | Attending: Physician Assistant | Admitting: Physician Assistant

## 2022-07-03 DIAGNOSIS — R079 Chest pain, unspecified: Secondary | ICD-10-CM

## 2022-07-19 ENCOUNTER — Ambulatory Visit
Admission: RE | Admit: 2022-07-19 | Discharge: 2022-07-19 | Disposition: A | Discharge status: Home / Self Care | Payer: Medicare Other | Source: Ambulatory Visit | Attending: Physician Assistant | Admitting: Physician Assistant

## 2022-07-19 ENCOUNTER — Other Ambulatory Visit: Payer: Self-pay | Admitting: Physician Assistant

## 2022-07-19 DIAGNOSIS — M542 Cervicalgia: Secondary | ICD-10-CM

## 2022-07-19 DIAGNOSIS — R0781 Pleurodynia: Secondary | ICD-10-CM

## 2023-01-27 IMAGING — CR DG CHEST 2V
2 series · 2 of 2 positions shown · non-contrast
Comparison: 05/20/2021.

CLINICAL DATA: Cough since having FTVUO-LG 4 weeks ago. Short of
breath. Chest tightness.

EXAM:
CHEST - 2 VIEW

[w chest pa]
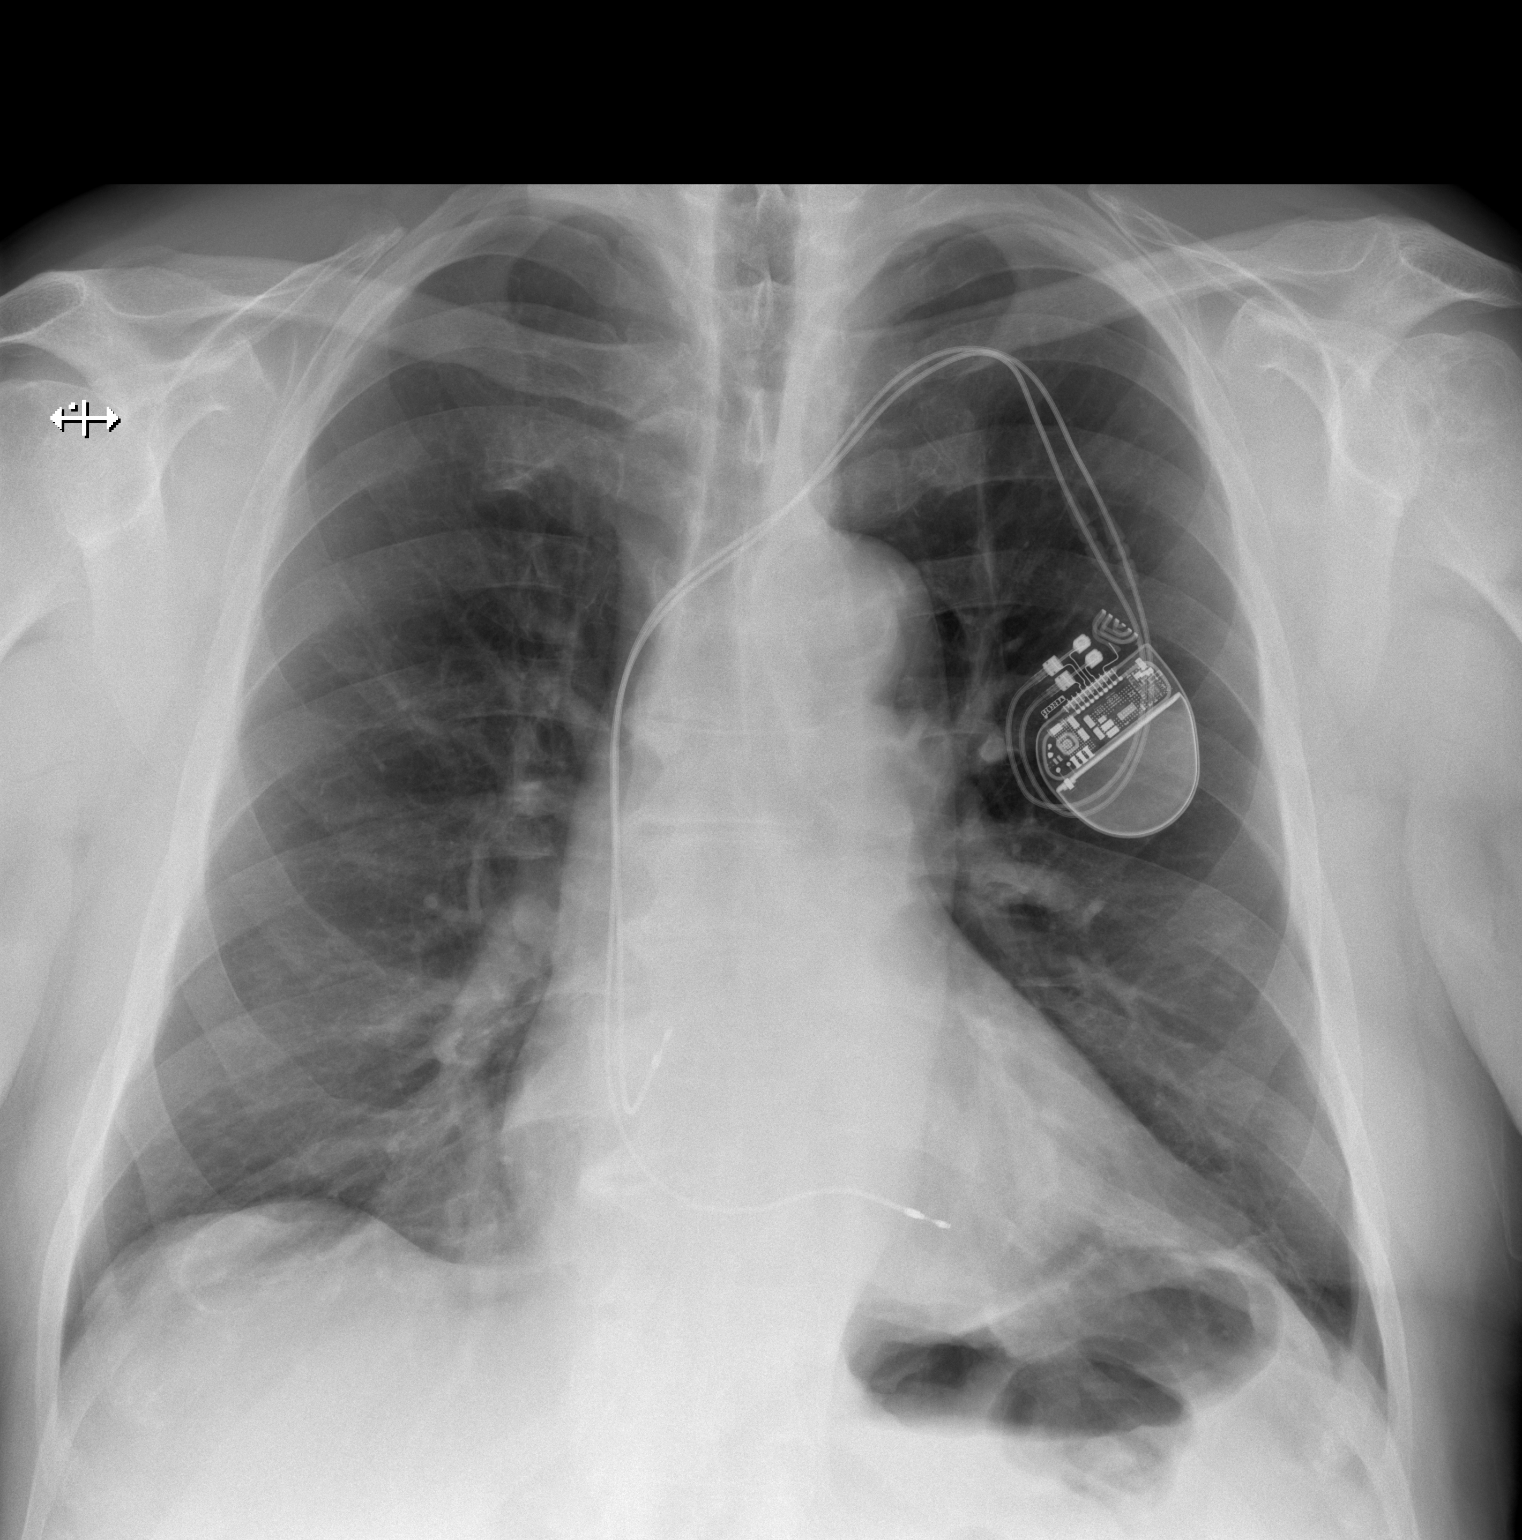

[w chest lat]
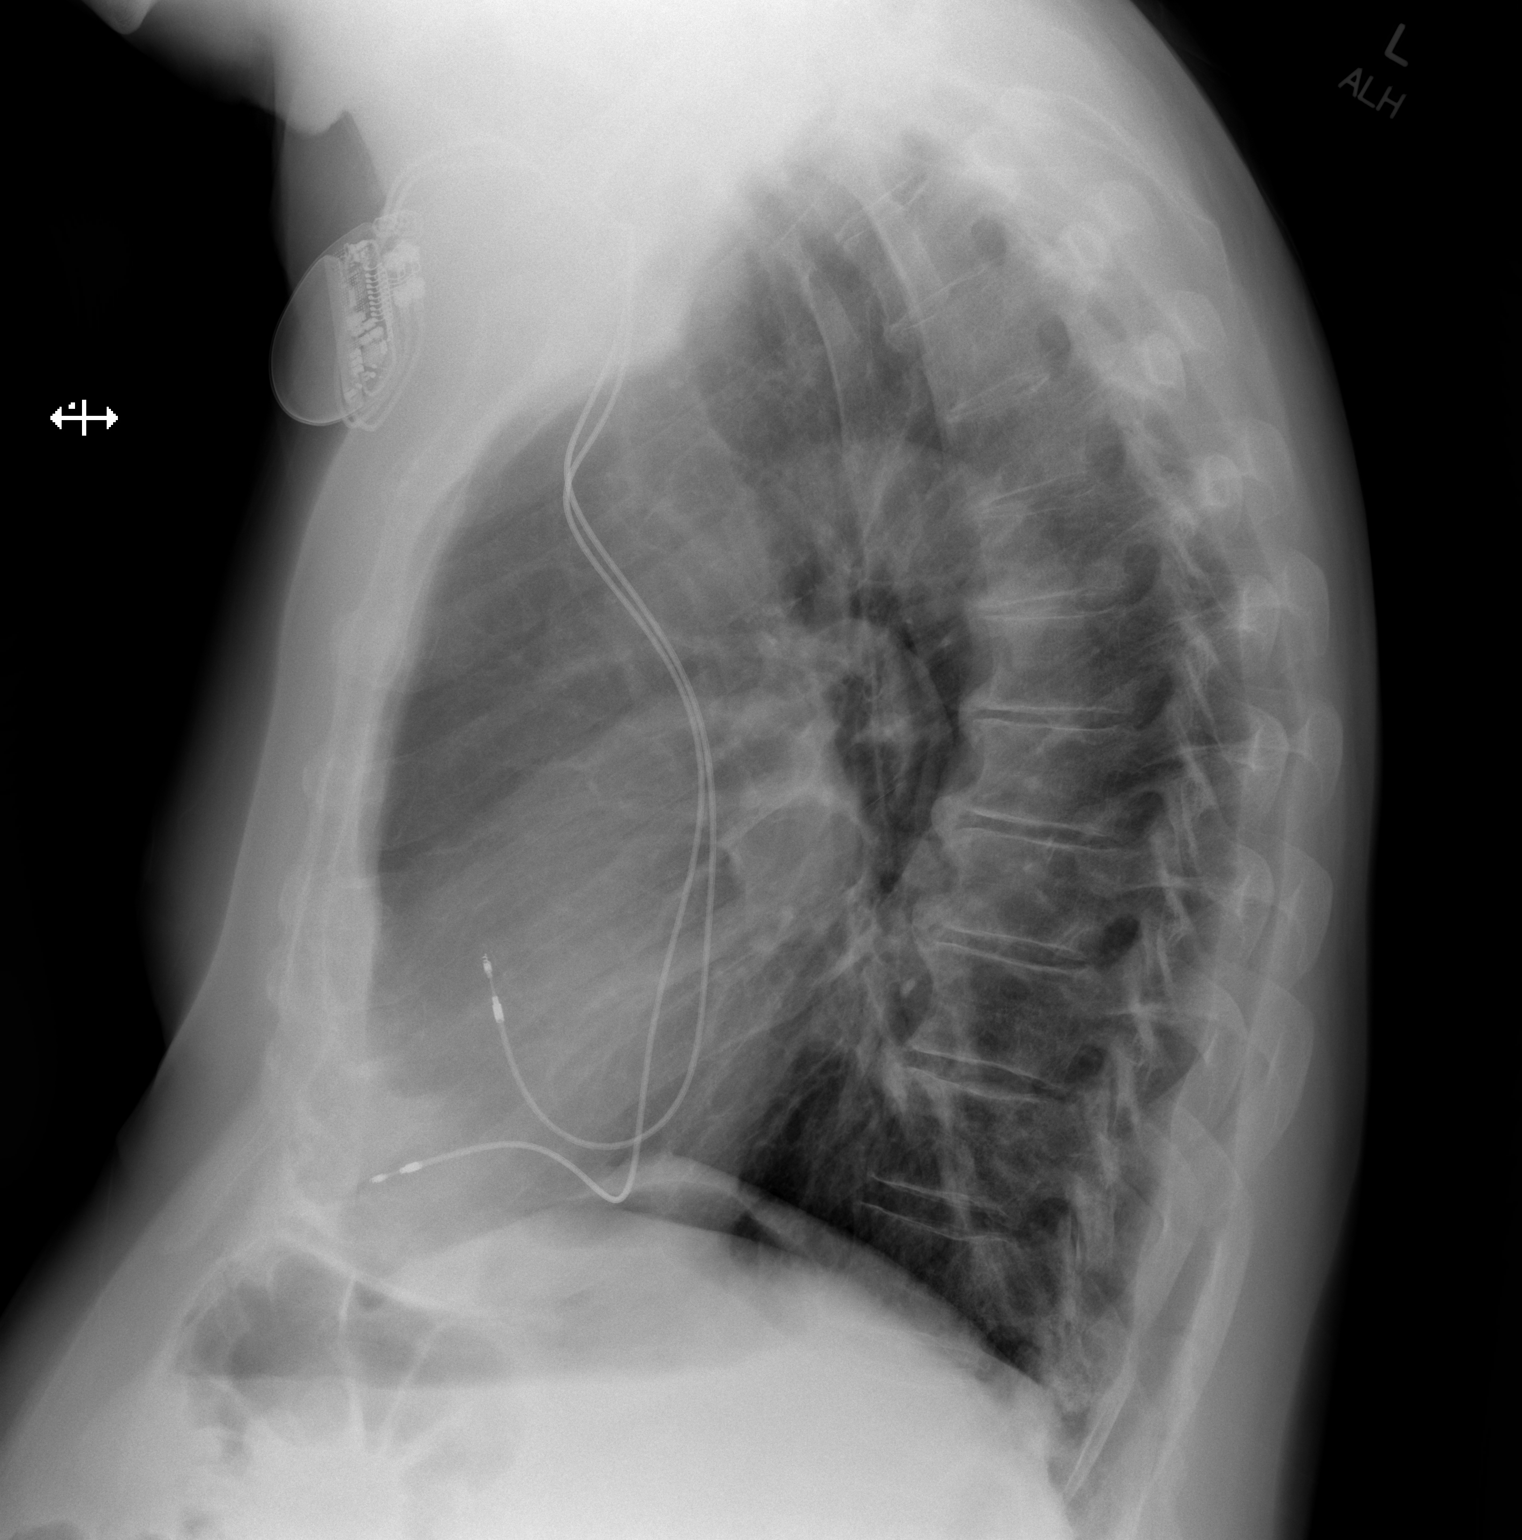

[2 of 2 positions shown; findings below may reference images not displayed]

FINDINGS: Cardiac silhouette is normal in size. Left anterior chest wall
sequential pacemaker is well positioned. No mediastinal or hilar
masses or evidence of adenopathy.

Clear lungs.  No pleural effusion or pneumothorax.

Skeletal structures are intact.
IMPRESSION: No active cardiopulmonary disease.

## 2023-07-30 ENCOUNTER — Other Ambulatory Visit (HOSPITAL_BASED_OUTPATIENT_CLINIC_OR_DEPARTMENT_OTHER): Payer: Self-pay

## 2023-11-16 ENCOUNTER — Other Ambulatory Visit (HOSPITAL_COMMUNITY): Payer: Self-pay | Admitting: Orthopedic Surgery

## 2023-11-16 DIAGNOSIS — M25562 Pain in left knee: Secondary | ICD-10-CM

## 2023-11-27 NOTE — Progress Notes (Signed)
 Date of Service: 11/23/23  Patient: Gordon Brooks MRN: [id number]  DEVICE CHECK: [employer] PPM Device diagnostics obtained during office interrogation. Reference scanned documents for detailed information and physician interpretation.   GINGER GALEAZZI, MA.

## 2023-11-30 NOTE — CV Procedure (Signed)
  Device system confirmed to be MRI conditional, with implant date > 6 weeks ago, and no evidence of abandoned or epicardial leads in review of most recent CXR  Device last cleared by EP Provider: Prentice Passey 11/29/23  Clearance is good through for 1 year as long as parameters remain stable at time of check. If pt undergoes a cardiac device procedure during that time, they should be re-cleared.   Tachy-therapies to be programmed off if applicable with device back to pre-MRI settings after completion of exam.  [employer] was available remotely to assist in programming recommendations.   Rocky Catalan, RT  11/30/2023 8:01 AM

## 2023-12-03 ENCOUNTER — Ambulatory Visit (HOSPITAL_COMMUNITY)
Admission: RE | Admit: 2023-12-03 | Discharge: 2023-12-03 | Disposition: A | Discharge status: Home / Self Care | Source: Ambulatory Visit | Attending: Orthopedic Surgery | Admitting: Orthopedic Surgery

## 2023-12-03 DIAGNOSIS — M25562 Pain in left knee: Secondary | ICD-10-CM | POA: Insufficient documentation

## 2023-12-03 NOTE — Progress Notes (Signed)
 Patient was monitored by this RN during MRI scan due to presence of a pacemaker. Cardiac rhythm was continuously monitored throughout the procedure. Prior to the start of the scan, the pacemaker was placed in MRI-safe mode by the MRI technician and/or pacemaker representative. Following the completion of the scan, the device was returned to its pre-MRI settings. Neurological status and orientation post-procedure were unchanged from baseline.   Pre-procedure Heart Rate (Prior to being placed in MRI safe mode): Post-procedure Heart Rate (Once pacemaker is returned to baseline mode):   60 : Pre 61 : Post

## 2024-02-26 ENCOUNTER — Ambulatory Visit: Admitting: Internal Medicine

## 2024-03-03 ENCOUNTER — Encounter (HOSPITAL_BASED_OUTPATIENT_CLINIC_OR_DEPARTMENT_OTHER): Payer: Self-pay

## 2024-03-04 ENCOUNTER — Ambulatory Visit: Attending: Internal Medicine | Admitting: Internal Medicine

## 2024-03-04 VITALS — BP 163/108 | HR 61 | Ht 76.0 in | Wt 264.0 lb

## 2024-03-04 DIAGNOSIS — I7121 Aneurysm of the ascending aorta, without rupture: Secondary | ICD-10-CM | POA: Diagnosis not present

## 2024-03-04 DIAGNOSIS — Z8241 Family history of sudden cardiac death: Secondary | ICD-10-CM | POA: Diagnosis not present

## 2024-03-04 DIAGNOSIS — I480 Paroxysmal atrial fibrillation: Secondary | ICD-10-CM | POA: Insufficient documentation

## 2024-03-04 NOTE — Progress Notes (Signed)
 Cardiology Office Note:  .    Date:  03/04/2024  ID:  Gordon Brooks, DOB 1945-08-02, MRN [id number] PCP: [place] Pac, MD  [facility] Providers Cardiologist:  None     CC: Aorta care Consulted for the evaluation of possible transition of care at the behest of Dr. [place]   History of Present Illness: Gordon Brooks    Gordon Brooks is a 78 y.o. male with a thoracic aortic aneurysm who presents for cardiovascular evaluation and management.  He has a thoracic aortic aneurysm, with the most recent measurement being 49 millimeters, which has increased from a stable 47 millimeters.  He has a history of paroxysmal atrial fibrillation and atrial flutter, for which he underwent atrial flutter ablation in 2010. He is predominantly AV paced and has a dual chamber [employer] MRI compatible pacemaker. He is monitored annually by [employer].  He experiences labile blood pressures and has been using a blood pressure monitor for the past two and a half months. He experiences episodes of dizziness and shortness of breath, which he attributes to low blood pressure due to inadequate fluid intake. His blood pressure has been more stable recently, with a reading of 125/78 mmHg yesterday.  He has a history of obstructive sleep apnea and chronic venous stasis disease, for which he has undergone venous insufficiency interventions in the past.  His family history is significant for heart issues among male relatives, including his father who had an aortic aneurysm repair and died suddenly, possibly from an aortic dissection.  Discussed the use of AI scribe software for clinical note transcription with the patient, who gave verbal consent to proceed.   Relevant histories: .  Social  - Last seen in [place]; has father hx of SCD, no biologic children ROS: As per HPI.   Physical Exam:    VS:  BP (!) 163/108   Pulse 61   Ht 6' 4 (1.93 m)   Wt 264 lb (119.7 kg)   SpO2 94%   BMI 32.14  kg/m    Wt Readings from Last 3 Encounters:  03/04/24 264 lb (119.7 kg)    Gen: no distress Cardiac: No Rubs or Gallops, soft diastolic Murmur, RRR +2 radial pulses Respiratory: Clear to auscultation bilaterally, normal effort, normal  respiratory rate GI: Soft, nontender, non-distended  MS: No  edema;  moves all extremities Integument: Skin feels warm Neuro:  At time of evaluation, alert and oriented to person/place/time/situation  Psych: Normal affect, patient feels ok    ASSESSMENT AND PLAN: .    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 No significant aortic regurgitation. Family history of sudden cardiac death. Previous evaluation by a heart surgeon indicated no immediate concern. Current management focuses on blood pressure control. Lisinopril may have a protective effect in aortopathies. CT scan is the gold standard for aortic dimension assessment. Echocardiogram will assess for aortic regurgitation and provide additional data. - Ordered echocardiogram to assess aortic root dilation and check for aortic regurgitation. - If echocardiogram shows significant changes, will order CT scan to confirm findings. - Continue annual CT scan monitoring if no significant changes. - Discussed genetic testing due to family history of sudden cardiac death (deferred) - Documented aortic aneurysm in allergy list to avoid Levaquin use.  Paroxysmal atrial fibrillation and atrial flutter, status post ablation, with dual chamber pacemaker Status post atrial flutter ablation in 2010.  Currently predominantly AV paced with a dual chamber pacemaker. No recent rhythm issues reported. Blood work shows normal counts despite anticoagulation therapy. No bleeding complications reported. Device monitoring is essential to ensure proper function and detect silent arrhythmias. - Referred to  electrophysiologist for device monitoring and follow-up. - Continue anticoagulation therapy unless blood counts become abnormal.  Labial blood pressure (essential hypertension) Blood pressure has been variable with recent readings of 125/78 mmHg. Previous episodes of hypotension associated with dizziness and shortness of breath, likely due to inadequate hydration. Current management includes lisinopril. Blood pressure control is crucial for managing thoracic aortic aneurysm. Lisinopril may be increased if blood pressure remains elevated. - Continue home blood pressure monitoring. - Increase lisinopril dosage if blood pressure exceeds 130/90 mmHg. - Ensure adequate hydration to prevent hypotension.  He will finish seeing Dr. Meade and let us  know how much, if any, of his care he would like to transition over  Gordon Leavens, MD FASE [facility] Cardiologist [facility]  [address] [place], [address] [place], [place] [address] [phone]  1:33 PM

## 2024-03-04 NOTE — Patient Instructions (Signed)
 Medication Instructions:   NO CHANGES today  *If you need a refill on your cardiac medications before your next appointment, please call your pharmacy*  Your next appointment:    Please contact our office about changing cardiology care / echocardiogram testing.   We recommend signing up for the patient portal called MyChart.  Sign up information is provided on this After Visit Summary.  MyChart is used to connect with patients for Virtual Visits (Telemedicine).  Patients are able to view lab/test results, encounter notes, upcoming appointments, etc.  Non-urgent messages can be sent to your provider as well.   To learn more about what you can do with MyChart, go to [email or web address].

## 2024-03-10 ENCOUNTER — Other Ambulatory Visit: Payer: Self-pay | Admitting: Orthopedic Surgery

## 2024-03-10 DIAGNOSIS — M1712 Unilateral primary osteoarthritis, left knee: Secondary | ICD-10-CM

## 2024-03-17 NOTE — Progress Notes (Signed)
 Chief Complaint: Patient was seen in consultation today for left knee pain.  Referring Physician(s): Marchwiany,Daniel A  History of Present Illness: Gordon Brooks is a 78 y.o. male with a medical history significant for HTN, atrial flutter on anticoagulation, pacemaker, and osteoarthritis with right shoulder and left knee pain. His left knee pain is worse with activity and better at rest. He notes occasional swelling and takes Naproxen as needed. He's also received knee injections in the past which provided only minimal relief. At his last visit with Dr. Edna the patient reported severe, debilitating left knee pain.   Dr.Marchwainy shared with the patient that his imaging shows mild degenerative changes and he would be hesitant to proceed with knee replacement in the setting of minimal arthritis. He discussed alternative treatments including viscosupplementation and geniculate artery embolization. Dr. Edna believed GAE would produce a better result and the patient was referred to Interventional Radiology. Mr. Holzhauer presents to the clinic today for further discussion.   [place] Pain Score = 53/96 VAS Pain Score =    No past medical history on file.  No past surgical history on file.  Allergies: Levaquin [levofloxacin]  Medications: Prior to Admission medications   Medication Sig Start Date End Date Taking? Authorizing Provider  Cholecalciferol 50 MCG (2000 UT) TABS Take by mouth.    [provider]  Coenzyme Q10 100 MG capsule Take 100 mg by mouth daily.    [provider]  finasteride (PROSCAR) 5 MG tablet Take 5 mg by mouth daily.    [provider]  ibuprofen  (ADVIL ) 800 MG tablet Take 1 tablet (800 mg total) by mouth every 6 (six) hours as needed. 09/13/20   Tobie Franky SQUIBB, DPM  lisinopril (ZESTRIL) 10 MG tablet daily. 06/25/23   [provider]  metoprolol succinate (TOPROL-XL) 25 MG 24 hr tablet Take by mouth in the morning and  at bedtime.    [provider]  rivaroxaban (XARELTO) 20 MG TABS tablet Take 20 mg by mouth daily with supper. 06/11/23   [provider]  rosuvastatin (CRESTOR) 20 MG tablet Take 20 mg by mouth at bedtime. 10/20/19   [provider]  sildenafil (VIAGRA) 100 MG tablet as needed for erectile dysfunction. 11/28/22   [provider]  tamsulosin (FLOMAX) 0.4 MG CAPS capsule Take 0.4 mg by mouth daily. 09/11/19   [provider]     No family history on file.  Social History   Socioeconomic History   Marital status: Married    Spouse name: Not on file   Number of children: Not on file   Years of education: Not on file   Highest education level: Not on file  Occupational History   Not on file  Tobacco Use   Smoking status: Not on file   Smokeless tobacco: Not on file  Substance and Sexual Activity   Alcohol use: Not on file   Drug use: Not on file   Sexual activity: Not on file  Other Topics Concern   Not on file  Social History Narrative   Not on file   Social Drivers of Health   Financial Resource Strain: Not on file  Food Insecurity: Not on file  Transportation Needs: Not on file  Physical Activity: Not on file  Stress: Not on file  Social Connections: Unknown (08/17/2022)   Received from [facility]   Social Network    Social Network: Not on file    Review of Systems: A 12 point  ROS discussed and pertinent positives are indicated in the HPI above.  All other systems are negative.  Review of Systems  Vital Signs: There were no vitals taken for this visit.  Advance Care Plan: The advanced care plan/surrogate decision maker was discussed at the time of visit and documented in the medical record.    Physical Exam  Imaging:  MRI Left Knee 12/03/23   IMPRESSION: 1. 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 2. 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 3. Small joint  effusion.  Labs:  CBC: No results for input(s): WBC, HGB, HCT, PLT in the last 8760 hours.  COAGS: No results for input(s): INR, APTT in the last 8760 hours.  BMP: No results for input(s): NA, K, CL, CO2, GLUCOSE, BUN, CALCIUM, CREATININE, GFRNONAA, GFRAA in the last 8760 hours.  Invalid input(s): CMP  LIVER FUNCTION TESTS: No results for input(s): BILITOT, AST, ALT, ALKPHOS, PROT, ALBUMIN in the last 8760 hours.  TUMOR MARKERS: No results for input(s): AFPTM, CEA, CA199, CHROMGRNA in the last 8760 hours.  Assessment and Plan:  78 year old male with a history of osteoarthritis with left knee pain. His osteoarthritis is considered mild on imaging and he's not a candidate for knee replacement at this time.   Thank you for this interesting consult.  I greatly enjoyed meeting Gordon Brooks and look forward to participating in their care.  A copy of this report was sent to the requesting provider on this date.  Ester Sides, MD Pager: [phone]    I spent a total of  40 Minutes   in face to face in clinical consultation, greater than 50% of which was counseling/coordinating care for left knee pain.

## 2024-03-19 ENCOUNTER — Inpatient Hospital Stay
Admission: RE | Admit: 2024-03-19 | Discharge: 2024-03-19 | Attending: Orthopedic Surgery | Admitting: Orthopedic Surgery

## 2024-03-19 DIAGNOSIS — M1712 Unilateral primary osteoarthritis, left knee: Secondary | ICD-10-CM

## 2024-03-19 HISTORY — PX: IR RADIOLOGIST EVAL & MGMT: IMG5224

## 2024-04-08 ENCOUNTER — Other Ambulatory Visit: Payer: Self-pay | Admitting: Interventional Radiology

## 2024-04-08 DIAGNOSIS — M1712 Unilateral primary osteoarthritis, left knee: Secondary | ICD-10-CM

## 2024-04-09 ENCOUNTER — Telehealth: Payer: Self-pay

## 2024-04-09 MED ORDER — METHYLPREDNISOLONE 4 MG PO TBPK: ORAL_TABLET | ORAL | 0 refills | Status: AC

## 2024-04-09 NOTE — Telephone Encounter (Signed)
 See telephone note  Pt asked if his new insurance was approved for GAE on Friday, 04-11-24, this RN sent message through teams for someone to reach out to him and confirm    Medrol dose pack cancelled by this RN on 04-11-2024 @ [facility]

## 2024-04-11 ENCOUNTER — Inpatient Hospital Stay: Admission: RE | Admit: 2024-04-11 | Source: Ambulatory Visit

## 2024-04-11 ENCOUNTER — Other Ambulatory Visit
